# Patient Record
Sex: Female | Born: 1970 | Race: Black or African American | Hispanic: No | Marital: Married | Smoking: Former smoker
Health system: Southern US, Community
[De-identification: ages and names within clinical notes are randomized; demographics above are authoritative.]

## PROBLEM LIST (undated history)

## (undated) DIAGNOSIS — J45909 Unspecified asthma, uncomplicated: Secondary | ICD-10-CM

## (undated) DIAGNOSIS — F419 Anxiety disorder, unspecified: Secondary | ICD-10-CM

## (undated) DIAGNOSIS — F4310 Post-traumatic stress disorder, unspecified: Secondary | ICD-10-CM

---

## 2018-04-23 ENCOUNTER — Encounter (HOSPITAL_COMMUNITY): Payer: Self-pay | Admitting: Emergency Medicine

## 2018-04-23 ENCOUNTER — Emergency Department (HOSPITAL_COMMUNITY): Payer: Medicaid - Out of State

## 2018-04-23 ENCOUNTER — Emergency Department (HOSPITAL_COMMUNITY)
Admission: EM | Admit: 2018-04-23 | Discharge: 2018-04-23 | Disposition: A | Discharge status: Home / Self Care | Payer: Medicaid - Out of State | Attending: Emergency Medicine | Admitting: Emergency Medicine

## 2018-04-23 ENCOUNTER — Other Ambulatory Visit: Payer: Self-pay

## 2018-04-23 DIAGNOSIS — R350 Frequency of micturition: Secondary | ICD-10-CM | POA: Insufficient documentation

## 2018-04-23 DIAGNOSIS — Z87891 Personal history of nicotine dependence: Secondary | ICD-10-CM | POA: Insufficient documentation

## 2018-04-23 DIAGNOSIS — Z79899 Other long term (current) drug therapy: Secondary | ICD-10-CM | POA: Insufficient documentation

## 2018-04-23 DIAGNOSIS — N83201 Unspecified ovarian cyst, right side: Secondary | ICD-10-CM | POA: Diagnosis not present

## 2018-04-23 DIAGNOSIS — M5441 Lumbago with sciatica, right side: Secondary | ICD-10-CM | POA: Insufficient documentation

## 2018-04-23 DIAGNOSIS — M5442 Lumbago with sciatica, left side: Secondary | ICD-10-CM | POA: Insufficient documentation

## 2018-04-23 DIAGNOSIS — J45909 Unspecified asthma, uncomplicated: Secondary | ICD-10-CM | POA: Diagnosis not present

## 2018-04-23 DIAGNOSIS — G8929 Other chronic pain: Secondary | ICD-10-CM | POA: Insufficient documentation

## 2018-04-23 DIAGNOSIS — R1032 Left lower quadrant pain: Secondary | ICD-10-CM

## 2018-04-23 HISTORY — DX: Anxiety disorder, unspecified: F41.9

## 2018-04-23 HISTORY — DX: Post-traumatic stress disorder, unspecified: F43.10

## 2018-04-23 HISTORY — DX: Unspecified asthma, uncomplicated: J45.909

## 2018-04-23 LAB — BASIC METABOLIC PANEL
ANION GAP: 6 (ref 5–15)
BUN: 20 mg/dL (ref 6–20)
CHLORIDE: 101 mmol/L (ref 98–111)
CO2: 29 mmol/L (ref 22–32)
Calcium: 9.3 mg/dL (ref 8.9–10.3)
Creatinine, Ser: 0.94 mg/dL (ref 0.44–1.00)
GFR calc Af Amer: >60 mL/min (ref >60)
GLUCOSE: 90 mg/dL (ref 70–99)
POTASSIUM: 4.2 mmol/L (ref 3.5–5.1)
Sodium: 136 mmol/L (ref 135–145)

## 2018-04-23 LAB — CBC WITH DIFFERENTIAL/PLATELET
ABS IMMATURE GRANULOCYTES: 0 10*3/uL (ref 0.0–0.1)
Basophils Absolute: 0 10*3/uL (ref 0.0–0.1)
Basophils Relative: 1 %
EOS PCT: 2 %
Eosinophils Absolute: 0.2 10*3/uL (ref 0.0–0.7)
HEMATOCRIT: 43.4 % (ref 36.0–46.0)
HEMOGLOBIN: 13.6 g/dL (ref 12.0–15.0)
Immature Granulocytes: 0 %
LYMPHS ABS: 3.3 10*3/uL (ref 0.7–4.0)
LYMPHS PCT: 44 %
MCH: 26.5 pg (ref 26.0–34.0)
MCHC: 31.3 g/dL (ref 30.0–36.0)
MCV: 84.4 fL (ref 78.0–100.0)
MONO ABS: 0.4 10*3/uL (ref 0.1–1.0)
MONOS PCT: 6 %
NEUTROS ABS: 3.5 10*3/uL (ref 1.7–7.7)
Neutrophils Relative %: 47 %
Platelets: 219 10*3/uL (ref 150–400)
RBC: 5.14 MIL/uL — ABNORMAL HIGH (ref 3.87–5.11)
RDW: 13.4 % (ref 11.5–15.5)
WBC: 7.4 10*3/uL (ref 4.0–10.5)

## 2018-04-23 LAB — URINALYSIS, ROUTINE W REFLEX MICROSCOPIC
Bilirubin Urine: NEGATIVE
GLUCOSE, UA: NEGATIVE mg/dL
Ketones, ur: NEGATIVE mg/dL
Leukocytes, UA: NEGATIVE
Nitrite: NEGATIVE
PH: 6 (ref 5.0–8.0)
PROTEIN: NEGATIVE mg/dL
Specific Gravity, Urine: 1.018 (ref 1.005–1.030)

## 2018-04-23 LAB — I-STAT BETA HCG BLOOD, ED (MC, WL, AP ONLY)

## 2018-04-23 MED ORDER — DICLOFENAC EPOLAMINE 1.3 % TD PTCH
1.0000 | MEDICATED_PATCH | Freq: Once | TRANSDERMAL | Status: DC
  Administered 2018-04-23: 1 via TRANSDERMAL
  Filled 2018-04-23: qty 1

## 2018-04-23 MED ORDER — DICLOFENAC EPOLAMINE 1.3 % TD PTCH: 1.0000 | MEDICATED_PATCH | Freq: Two times a day (BID) | TRANSDERMAL | 1 refills | Status: AC

## 2018-04-23 NOTE — ED Notes (Signed)
Pt returned from imaging.

## 2018-04-23 NOTE — ED Triage Notes (Signed)
Pt. Stated, Im going to the bathroom a lot and both of my legs are hurting because I have sciatic pain

## 2018-04-23 NOTE — Discharge Instructions (Signed)
As discussed, your evaluation today has been largely reassuring.  But, it is important that you monitor your condition carefully, and do not hesitate to return to the ED if you develop new, or concerning changes in your condition.  For pain relief, Tylenol, ibuprofen and topical medication patches are appropriate. If you are unable to obtain the prescription, please ask the pharmacist for topical patches including dimethyl salicylate, Andrea Hicks is one brand that has this type of product.   Otherwise, please follow-up with your physician for appropriate ongoing care.

## 2018-04-23 NOTE — ED Provider Notes (Signed)
[facility] EMERGENCY DEPARTMENT Provider Note   CSN: [id number] Arrival date & time: 04/23/18  5284     History   Chief Complaint Chief Complaint  Patient presents with  . Abdominal Pain  . Back Pain  . Leg Pain  . Urinary Frequency    HPI Sharea Guinther is a 47 y.o. female.  HPI  Patient presents with concern of low back pain, left lower quadrant pain, polyuria. She has a history of sciatica, anxiety, but states that she is generally well. She has recurrent episodes of low back pain, which she states that she is currently experiencing in a typical manner, with diffuse low back pain, with radiation down both legs symmetrically. This is typical for her, and typically she is having minimal relief in spite of using gabapentin. She is also prescribed a topical pain patches, though she notes that she did not has obtain these due to cost concerns. She denies changes from typical sciatica episodes, but notes that her pain is becoming worse. In addition, patient recently developed left lower quadrant pain described as sore, mild. There is associated dysuria, polyuria. Last menstrual period ended 1 week ago. No fever, no vomiting. Patient is visiting from [place] due to a sick family member.   Past Medical History:  Diagnosis Date  . Anxiety   . Asthma   . PTSD (post-traumatic stress disorder)     There are no active problems to display for this patient.   History reviewed. No pertinent surgical history.   OB History   None      Home Medications    Prior to Admission medications   Medication Sig Start Date End Date Taking? Authorizing Provider  albuterol (ACCUNEB) 0.63 MG/3ML nebulizer solution Take 1 ampule by nebulization every 6 (six) hours as needed for wheezing.   Yes [provider]  albuterol (PROVENTIL HFA;VENTOLIN HFA) 108 (90 Base) MCG/ACT inhaler Inhale 1-2 puffs into the lungs every 6 (six) hours as needed for wheezing or  shortness of breath.   Yes [provider]  budesonide-formoterol (SYMBICORT) 160-4.5 MCG/ACT inhaler Inhale 2 puffs into the lungs 2 (two) times daily.   Yes [provider]  gabapentin (NEURONTIN) 100 MG capsule Take 100 mg by mouth 2 (two) times daily as needed (sciatica).   Yes [provider]  venlafaxine (EFFEXOR) 25 MG tablet Take 50 mg by mouth daily.   Yes [provider]    Family History No family history on file.  Social History Social History   Tobacco Use  . Smoking status: Former Games developer  . Smokeless tobacco: Former Engineer, water Use Topics  . Alcohol use: Yes  . Drug use: Not Currently     Allergies   Ambien [zolpidem tartrate]   Review of Systems Review of Systems  Constitutional:       Per HPI, otherwise negative  HENT:       Per HPI, otherwise negative  Respiratory:       Per HPI, otherwise negative  Cardiovascular:       Per HPI, otherwise negative  Gastrointestinal: Negative for vomiting.  Endocrine:       Negative aside from HPI  Genitourinary:       Neg aside from HPI   Musculoskeletal:       Per HPI, otherwise negative  Skin: Negative.   Neurological: Negative for syncope.     Physical Exam Updated Vital Signs BP 108/73   Pulse (!) 50   Temp 97.7  F (36.5 C) (Oral)   Resp 17   Ht 5\' 8"  (1.727 m)   Wt 77.6 kg   LMP 04/13/2018   SpO2 100%   BMI 26.00 kg/m   Physical Exam  Constitutional: She is oriented to person, place, and time. She appears well-developed and well-nourished. No distress.  HENT:  Head: Normocephalic and atraumatic.  Eyes: Conjunctivae and EOM are normal.  Cardiovascular: Normal rate and regular rhythm.  Pulmonary/Chest: Effort normal and breath sounds normal. No stridor. No respiratory distress.  Abdominal: She exhibits no distension.  Minimal tenderness to palpation left lower quadrant otherwise abdominal exam unremarkable  Musculoskeletal: She exhibits no edema.        Arms: Neurological: She is alert and oriented to person, place, and time. She displays no atrophy and no tremor. No cranial nerve deficit or sensory deficit. She exhibits normal muscle tone. Coordination normal.  Skin: Skin is warm and dry.  Psychiatric: She has a normal mood and affect.  Nursing note and vitals reviewed.    ED Treatments / Results  Labs (all labs ordered are listed, but only abnormal results are displayed) Labs Reviewed  URINALYSIS, ROUTINE W REFLEX MICROSCOPIC - Abnormal; Notable for the following components:      Result Value   APPearance HAZY (*)    Hgb urine dipstick SMALL (*)    Bacteria, UA RARE (*)    All other components within normal limits  CBC WITH DIFFERENTIAL/PLATELET - Abnormal; Notable for the following components:   RBC 5.14 (*)    All other components within normal limits  BASIC METABOLIC PANEL  I-STAT BETA HCG BLOOD, ED ([facility], [facility], [facility] ONLY)    EKG None  Radiology Ct Renal Stone Study  Result Date: 04/23/2018 CLINICAL DATA:  Left lower quadrant and back and flank pain. Urinary frequency. EXAM: CT ABDOMEN AND PELVIS WITHOUT CONTRAST TECHNIQUE: Multidetector CT imaging of the abdomen and pelvis was performed following the standard protocol without IV contrast. COMPARISON:  None. FINDINGS: Lower chest: No significant pulmonary nodules or acute consolidative airspace disease. Hepatobiliary: Normal liver size. No liver mass. Normal gallbladder with no radiopaque cholelithiasis. No biliary ductal dilatation. Pancreas: Normal, with no mass or duct dilation. Spleen: Normal size. No mass. Adrenals/Urinary Tract: Normal adrenals. No renal stones. No hydronephrosis. Subcentimeter hypodense renal cortical lesion in the medial upper left kidney, too small to characterize, which requires no follow-up. Otherwise no contour deforming renal lesions. Normal caliber ureters, with no ureteral stones. Normal bladder. Stomach/Bowel: Normal non-distended stomach. Normal  caliber small bowel with no small bowel wall thickening. Normal appendix. Normal large bowel with no diverticulosis, large bowel wall thickening or pericolonic fat stranding. Vascular/Lymphatic: Atherosclerotic nonaneurysmal abdominal aorta. No pathologically enlarged lymph nodes in the abdomen or pelvis. Reproductive: Simple 1.6 cm right adnexal cyst. No left adnexal mass. Grossly normal anteverted uterus. Other: No pneumoperitoneum, ascites or focal fluid collection. Musculoskeletal: No aggressive appearing focal osseous lesions. Mild thoracolumbar spondylosis. IMPRESSION: 1. No acute abnormality. No urolithiasis. No hydronephrosis. No evidence of bowel obstruction or acute bowel inflammation. 2. Simple small 1.6 cm right adnexal cyst, for which no follow-up is required. This recommendation follows ACR consensus guidelines: White Paper of the ACR Incidental Findings Committee II on Adnexal Findings. J Am Coll Radiol 2013:10:675-681. 3.  Aortic Atherosclerosis (ICD10-I70.0). Electronically Signed   By: Delbert Phenix M.D.   On: 04/23/2018 10:07    Procedures Procedures (including critical care time)  Medications Ordered in ED Medications  diclofenac (FLECTOR) 1.3 % 1 patch (  1 patch Transdermal Patch Applied 04/23/18 0813)     Initial Impression / Assessment and Plan / ED Course  I have reviewed the triage vital signs and the nursing notes.  Pertinent labs & imaging results that were available during my care of the patient were reviewed by me and considered in my medical decision making (see chart for details).     10:59 AM Patient awake, alert, using her cellular telephone without any apparent discomfort. We discussed all findings including reassuring labs, aside from some evidence for hematuria, which prompted CT scan. CT scan reassuring as well, though there is adnexal cyst. On affected side, there is no notable findings, patient may have passed a kidney stone, which would explain her back  pain, inguinal crease pain. This possibilities comp gated by the patient's history of chronic back pain and sciatica. However, with a reassuring abdominal exam, reassuring labs, no fever, absence of distress, patient is appropriate for discharge with close outpatient follow-up. We discussed home medication regimen including ibuprofen, Tylenol, topical medication patches.  Patient will follow-up when she returns home or return for concerning changes.  Final Clinical Impressions(s) / ED Diagnoses  Abdominal pain Back pain   Gerhard MunchLockwood, Edona Schreffler, MD 04/23/18 1101

## 2018-04-23 NOTE — ED Notes (Signed)
Patient transported to CT 

## 2019-05-30 IMAGING — CT CT RENAL STONE PROTOCOL
2 of 4 series · 15 of 46 positions shown, 17 images · non-contrast
Comparison: None.

CLINICAL DATA: Left lower quadrant and back and flank pain. Urinary
frequency.

EXAM:
CT ABDOMEN AND PELVIS WITHOUT CONTRAST
TECHNIQUE: Multidetector CT imaging of the abdomen and pelvis was performed
following the standard protocol without IV contrast.

[Series 3: stone study 5.0 i30f 2 · axial · 0.86mm/px · z∈[+817,+1257]mm · 12 of 98 slices shown, 14 images]
[im 5/98  soft-tissue]
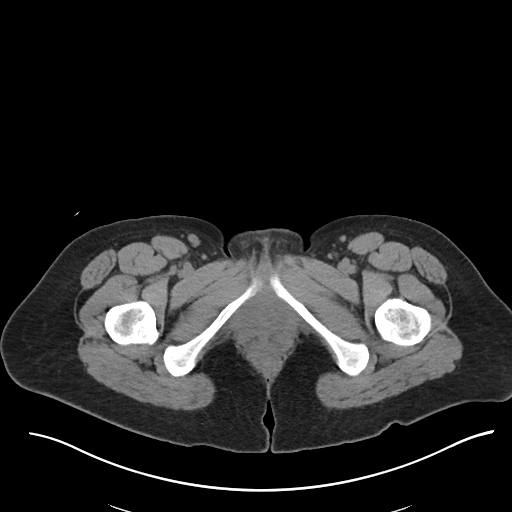
[im 5/98  bone]
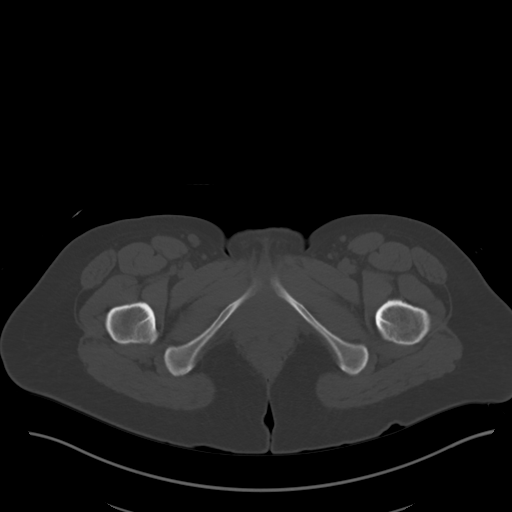
[im 13/98  soft-tissue]
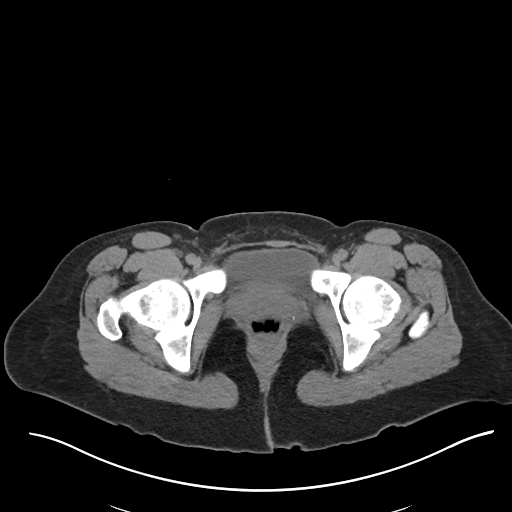
[im 22/98  soft-tissue]
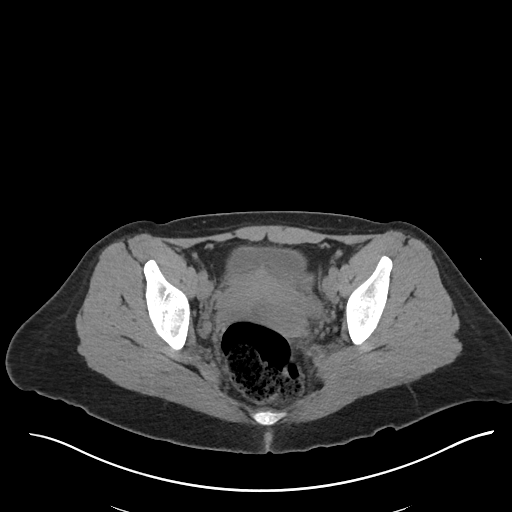
[im 30/98  soft-tissue]
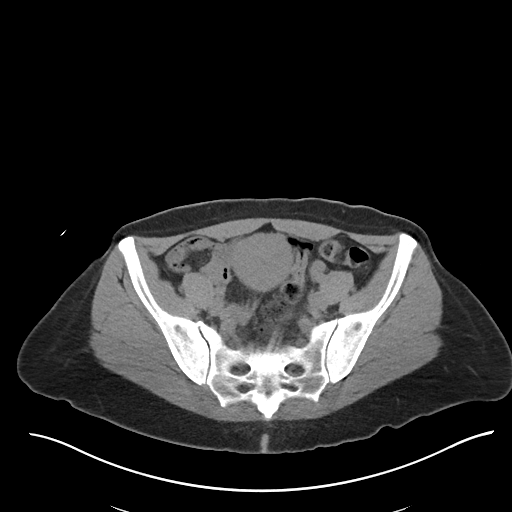
[im 38/98  soft-tissue]
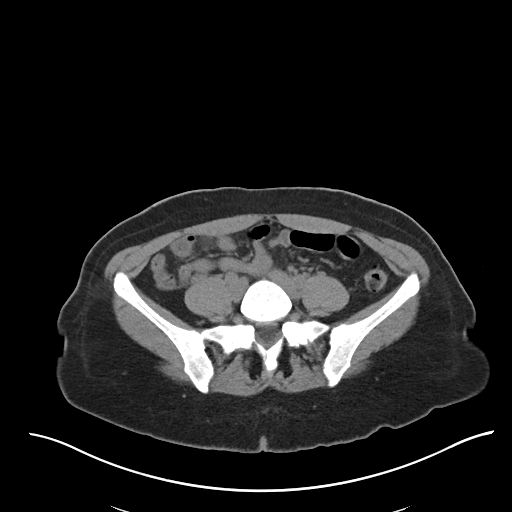
[im 47/98  soft-tissue]
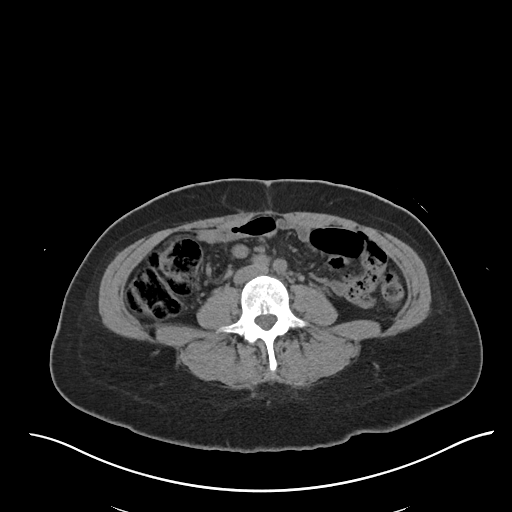
[im 51/98  soft-tissue]
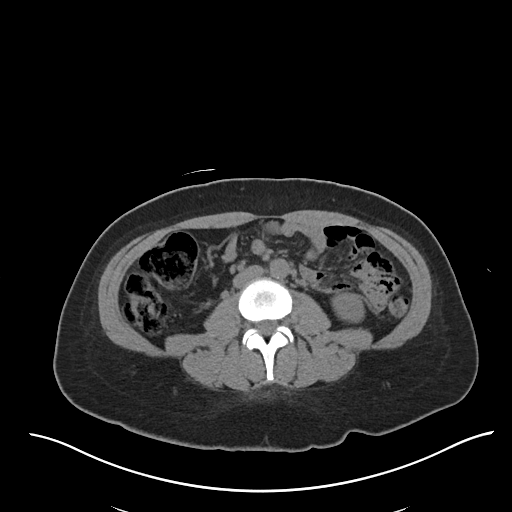
[im 60/98  soft-tissue]
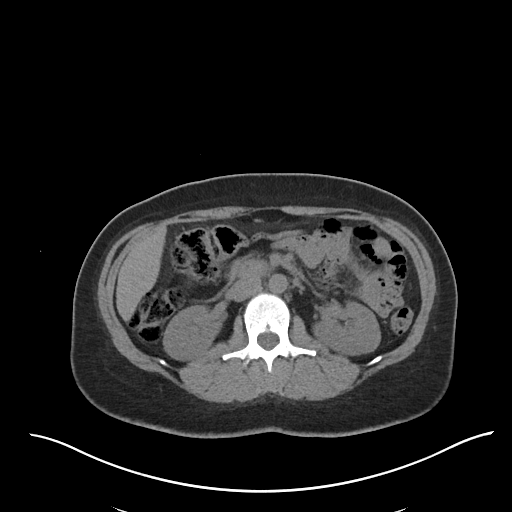
[im 68/98  soft-tissue]
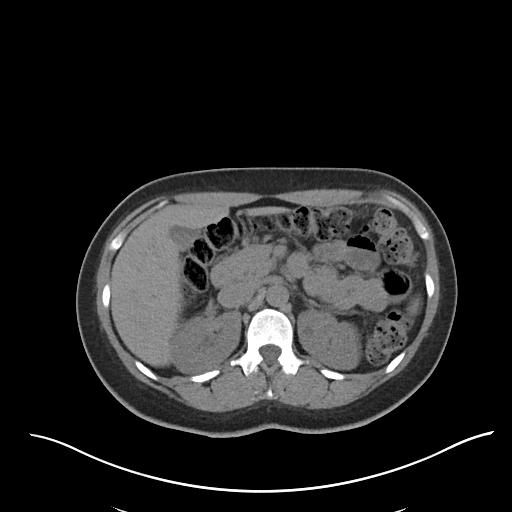
[im 68/98  bone]
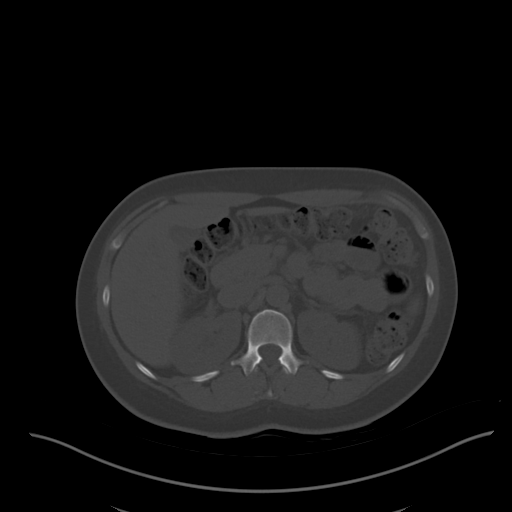
[im 76/98  soft-tissue]
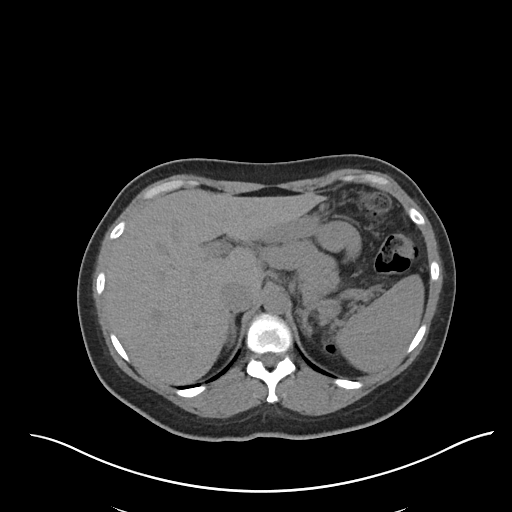
[im 85/98  soft-tissue]
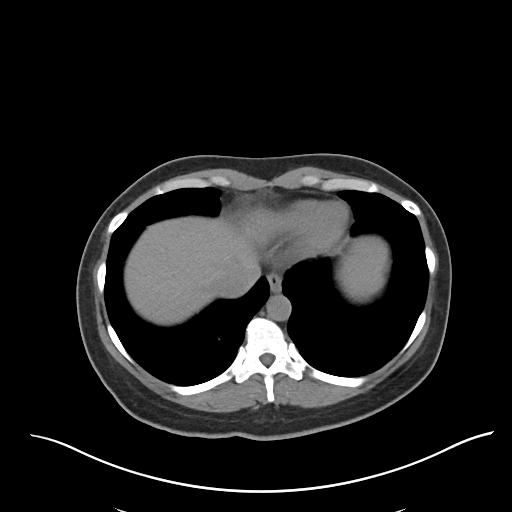
[im 93/98  soft-tissue]
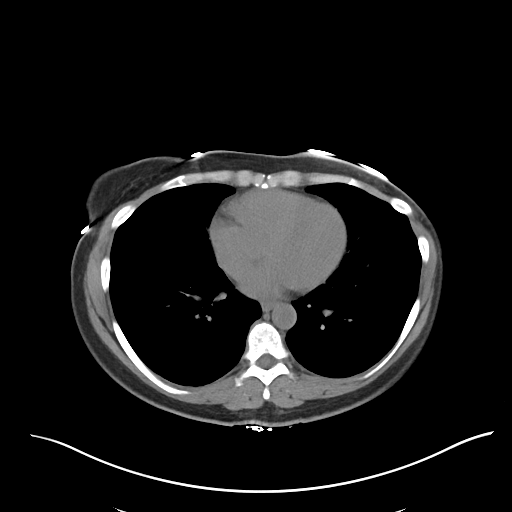

[Series 6: coronal soft tissue · coronal · 0.65mm/px · 3 of 100 slices shown]
[im 34/100  soft-tissue]
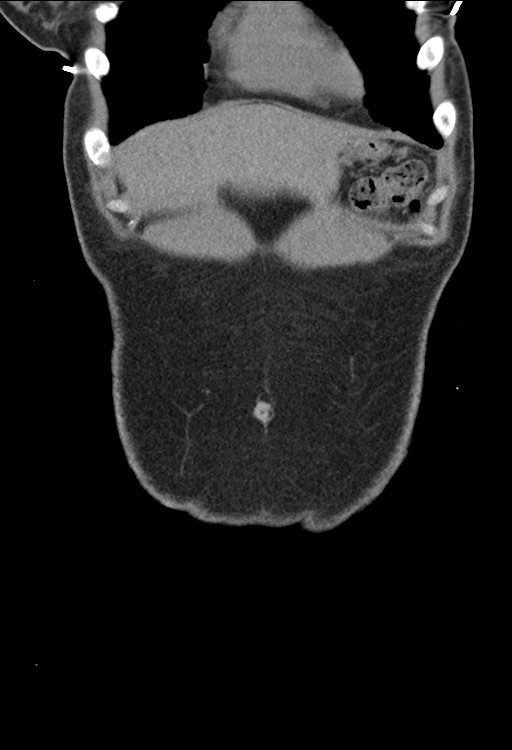
[im 45/100  soft-tissue]
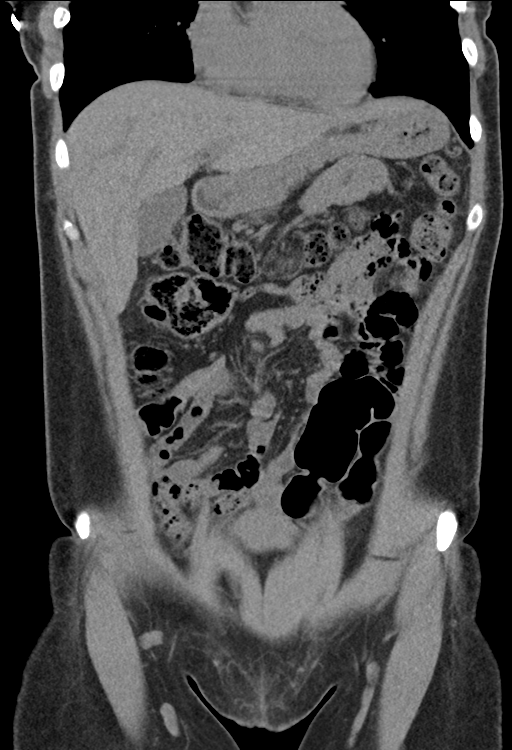
[im 56/100  soft-tissue]
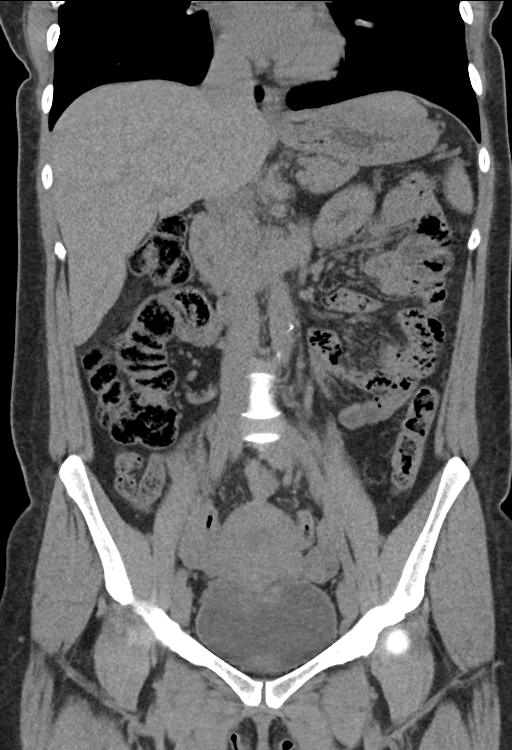

[15 of 46 positions shown; findings below may reference images not displayed]

FINDINGS: Lower chest: No significant pulmonary nodules or acute consolidative
airspace disease.

Hepatobiliary: Normal liver size. No liver mass. Normal gallbladder
with no radiopaque cholelithiasis. No biliary ductal dilatation.

Pancreas: Normal, with no mass or duct dilation.

Spleen: Normal size. No mass.

Adrenals/Urinary Tract: Normal adrenals. No renal stones. No
hydronephrosis. Subcentimeter hypodense renal cortical lesion in the
medial upper left kidney, too small to characterize, which requires
no follow-up. Otherwise no contour deforming renal lesions. Normal
caliber ureters, with no ureteral stones. Normal bladder.

Stomach/Bowel: Normal non-distended stomach. Normal caliber small
bowel with no small bowel wall thickening. Normal appendix. Normal
large bowel with no diverticulosis, large bowel wall thickening or
pericolonic fat stranding.

Vascular/Lymphatic: Atherosclerotic nonaneurysmal abdominal aorta.
No pathologically enlarged lymph nodes in the abdomen or pelvis.

Reproductive: Simple 1.6 cm right adnexal cyst. No left adnexal
mass. Grossly normal anteverted uterus.

Other: No pneumoperitoneum, ascites or focal fluid collection.

Musculoskeletal: No aggressive appearing focal osseous lesions. Mild
thoracolumbar spondylosis.
IMPRESSION: 1. No acute abnormality. No urolithiasis. No hydronephrosis. No
evidence of bowel obstruction or acute bowel inflammation.
2. Simple small 1.6 cm right adnexal cyst, for which no follow-up is
required. This recommendation follows ACR consensus guidelines:
White Paper of the ACR Incidental Findings Committee II on Adnexal
Findings. [HOSPITAL] [DATE].
3.  Aortic Atherosclerosis ([id number]).
